# Patient Record
Sex: Female | Born: 2000 | Race: White | Hispanic: No | Marital: Single | Smoking: Current some day smoker
Health system: Southern US, Community
[De-identification: ages and names within clinical notes are randomized; demographics above are authoritative.]

## PROBLEM LIST (undated history)

## (undated) ENCOUNTER — Inpatient Hospital Stay: Payer: Self-pay

---
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
## 2018-07-04 ENCOUNTER — Other Ambulatory Visit: Payer: Self-pay | Admitting: Primary Care

## 2018-07-04 DIAGNOSIS — R102 Pelvic and perineal pain: Secondary | ICD-10-CM

## 2018-07-10 ENCOUNTER — Ambulatory Visit
Admission: RE | Admit: 2018-07-10 | Discharge: 2018-07-10 | Disposition: A | Discharge status: Home / Self Care | Payer: Medicaid Other | Source: Ambulatory Visit | Attending: Primary Care | Admitting: Primary Care

## 2018-07-10 DIAGNOSIS — N83292 Other ovarian cyst, left side: Secondary | ICD-10-CM | POA: Diagnosis not present

## 2018-07-10 DIAGNOSIS — R102 Pelvic and perineal pain: Secondary | ICD-10-CM | POA: Insufficient documentation

## 2018-12-24 ENCOUNTER — Other Ambulatory Visit: Payer: Self-pay

## 2018-12-24 ENCOUNTER — Emergency Department: Payer: Self-pay

## 2018-12-24 ENCOUNTER — Emergency Department
Admission: EM | Admit: 2018-12-24 | Discharge: 2018-12-24 | Disposition: A | Discharge status: Home / Self Care | Payer: Self-pay | Attending: Emergency Medicine | Admitting: Emergency Medicine

## 2018-12-24 DIAGNOSIS — F17200 Nicotine dependence, unspecified, uncomplicated: Secondary | ICD-10-CM | POA: Insufficient documentation

## 2018-12-24 DIAGNOSIS — R1031 Right lower quadrant pain: Secondary | ICD-10-CM

## 2018-12-24 DIAGNOSIS — N3001 Acute cystitis with hematuria: Secondary | ICD-10-CM | POA: Insufficient documentation

## 2018-12-24 DIAGNOSIS — R319 Hematuria, unspecified: Secondary | ICD-10-CM

## 2018-12-24 DIAGNOSIS — Z79899 Other long term (current) drug therapy: Secondary | ICD-10-CM | POA: Insufficient documentation

## 2018-12-24 DIAGNOSIS — N390 Urinary tract infection, site not specified: Secondary | ICD-10-CM

## 2018-12-24 LAB — CBC WITH DIFFERENTIAL/PLATELET
Abs Immature Granulocytes: 0.02 10*3/uL (ref 0.00–0.07)
Basophils Absolute: 0 10*3/uL (ref 0.0–0.1)
Basophils Relative: 0 %
Eosinophils Absolute: 0.1 10*3/uL (ref 0.0–0.5)
Eosinophils Relative: 2 %
HCT: 42.5 % (ref 36.0–46.0)
Hemoglobin: 15.1 g/dL — ABNORMAL HIGH (ref 12.0–15.0)
Immature Granulocytes: 0 %
Lymphocytes Relative: 37 %
Lymphs Abs: 3.4 10*3/uL (ref 0.7–4.0)
MCH: 29.8 pg (ref 26.0–34.0)
MCHC: 35.5 g/dL (ref 30.0–36.0)
MCV: 84 fL (ref 80.0–100.0)
Monocytes Absolute: 0.5 10*3/uL (ref 0.1–1.0)
Monocytes Relative: 6 %
Neutro Abs: 5.2 10*3/uL (ref 1.7–7.7)
Neutrophils Relative %: 55 %
Platelets: 251 10*3/uL (ref 150–400)
RBC: 5.06 MIL/uL (ref 3.87–5.11)
RDW: 11.8 % (ref 11.5–15.5)
WBC: 9.2 10*3/uL (ref 4.0–10.5)
nRBC: 0 % (ref 0.0–0.2)

## 2018-12-24 LAB — COMPREHENSIVE METABOLIC PANEL
ALT: 17 U/L (ref 0–44)
AST: 20 U/L (ref 15–41)
Albumin: 4.8 g/dL (ref 3.5–5.0)
Alkaline Phosphatase: 82 U/L (ref 38–126)
Anion gap: 10 (ref 5–15)
BUN: 15 mg/dL (ref 6–20)
CO2: 24 mmol/L (ref 22–32)
Calcium: 8.7 mg/dL — ABNORMAL LOW (ref 8.9–10.3)
Chloride: 105 mmol/L (ref 98–111)
Creatinine, Ser: 0.57 mg/dL (ref 0.44–1.00)
GFR calc Af Amer: >60 mL/min (ref >60)
GFR calc non Af Amer: >60 mL/min (ref >60)
Glucose, Bld: 109 mg/dL — ABNORMAL HIGH (ref 70–99)
Potassium: 3.8 mmol/L (ref 3.5–5.1)
Sodium: 139 mmol/L (ref 135–145)
Total Bilirubin: 0.6 mg/dL (ref 0.3–1.2)
Total Protein: 7.5 g/dL (ref 6.5–8.1)

## 2018-12-24 LAB — WET PREP, GENITAL
Clue Cells Wet Prep HPF POC: NONE SEEN
Sperm: NONE SEEN
Trich, Wet Prep: NONE SEEN
Yeast Wet Prep HPF POC: NONE SEEN

## 2018-12-24 LAB — URINALYSIS, ROUTINE W REFLEX MICROSCOPIC
Bilirubin Urine: NEGATIVE
Glucose, UA: NEGATIVE mg/dL
Ketones, ur: NEGATIVE mg/dL
Nitrite: NEGATIVE
Protein, ur: 100 mg/dL — AB
RBC / HPF: >50 RBC/hpf — ABNORMAL HIGH (ref 0–5)
Specific Gravity, Urine: 1.012 (ref 1.005–1.030)
WBC, UA: >50 WBC/hpf — ABNORMAL HIGH (ref 0–5)
pH: 6 (ref 5.0–8.0)

## 2018-12-24 LAB — LIPASE, BLOOD: Lipase: 25 U/L (ref 11–51)

## 2018-12-24 LAB — POCT PREGNANCY, URINE: Preg Test, Ur: NEGATIVE

## 2018-12-24 LAB — CHLAMYDIA/NGC RT PCR (ARMC ONLY): Chlamydia Tr: NOT DETECTED

## 2018-12-24 LAB — CHLAMYDIA/NGC RT PCR (ARMC ONLY)??????????: N gonorrhoeae: NOT DETECTED

## 2018-12-24 MED ORDER — CEPHALEXIN 500 MG PO CAPS
500.0000 mg | ORAL_CAPSULE | Freq: Once | ORAL | Status: AC
  Administered 2018-12-24: 500 mg via ORAL
  Filled 2018-12-24: qty 1

## 2018-12-24 MED ORDER — IOHEXOL 300 MG/ML  SOLN
75.0000 mL | Freq: Once | INTRAMUSCULAR | Status: AC | PRN
  Administered 2018-12-24: 75 mL via INTRAVENOUS

## 2018-12-24 MED ORDER — CEPHALEXIN 500 MG PO CAPS: 500.0000 mg | ORAL_CAPSULE | Freq: Four times a day (QID) | ORAL | 0 refills | Status: AC

## 2018-12-24 NOTE — ED Provider Notes (Signed)
[facility] Emergency Department Provider Note  ____________________________________________   First MD Initiated Contact with Patient 12/24/18 0222     (approximate)  I have reviewed the triage vital signs and the nursing notes.   HISTORY  Chief Complaint Abdominal Pain (?UTI)    HPI Nichole Hernandez is a 18 y.o. female with medical history as listed below who reports no chronic medical history and presents for evaluation of persistent but intermittent right lower quadrant pain for about 3 days.  She says that nothing in particular makes it better or worse and that it is quite severe and "excruciating" at times.  She said it is gone now because she took some ibuprofen at home but it has been severe recently.  It is been accompanied occasionally with nausea and at least one episode of vomiting, but she says she thinks that is because the pain was so bad.  She denies fever/chills, chest pain, shortness of breath, cough, upper abdominal pain, and any abnormal vaginal discomfort.  Her last menstrual period was about a week and a half ago.  She says that her last few menstrual cycles have been irregular and that she has been diagnosed with an ovarian cyst on the left side before that caused a similar kind of pain.  She does not take any medication regularly.  She says that she was diagnosed with a urinary tract infection "a while ago" and the first antibiotic she was given caused an allergic reaction but she completed the second dose.  She never had retesting to see if her UTI resolved.  She is having no dysuria nor hematuria at this time.  She is sexually active without using condoms with one partner and has been in a monogamous relationship for 3 years with this partner.         History reviewed. No pertinent past medical history.  There are no active problems to display for this patient.   History reviewed. No pertinent surgical history.  Prior to Admission  medications   Medication Sig Start Date End Date Taking? Authorizing Provider  cephALEXin (KEFLEX) 500 MG capsule Take 1 capsule (500 mg total) by mouth 4 (four) times daily for 12 days. 12/24/18 01/05/19  Loleta RoseForbach, Creed Kail, MD  cetirizine (ZYRTEC) 10 MG tablet Take 1 tablet (10 mg total) by mouth daily. 10/06/15   Cuthriell, Delorise RoyalsJonathan D, PA-C  fluticasone (FLONASE) 50 MCG/ACT nasal spray Place 1 spray into both nostrils 2 (two) times daily. 10/06/15   Cuthriell, Delorise RoyalsJonathan D, PA-C  magic mouthwash w/lidocaine SOLN Take 5 mLs by mouth 4 (four) times daily. 10/06/15   Cuthriell, Delorise RoyalsJonathan D, PA-C  methylPREDNISolone (MEDROL DOSEPAK) 4 MG TBPK tablet Take Tapered dose as directed 03/19/16   Joni ReiningSmith, Ronald K, PA-C    Allergies Amoxicillin  No family history on file.  Social History Social History   Tobacco Use   Smoking status: Current Some Day Smoker  Substance Use Topics   Alcohol use: No   Drug use: Not on file    Review of Systems Constitutional: No fever/chills Eyes: No visual changes. ENT: No sore throat. Cardiovascular: Denies chest pain. Respiratory: Denies shortness of breath. Gastrointestinal: Intermittent but recurrent right lower quadrant abdominal pain for about 3 days accompanied with nausea and at least one episode of emesis. Genitourinary: Negative for dysuria.  Last menstrual period was about a week and half ago. Musculoskeletal: Negative for neck pain.  Negative for back pain. Integumentary: Negative for rash. Neurological: Negative for headaches, focal  weakness or numbness.   ____________________________________________   PHYSICAL EXAM:  VITAL SIGNS: ED Triage Vitals  Enc Vitals Group     BP 12/24/18 0219 (!) 121/92     Pulse Rate 12/24/18 0219 94     Resp 12/24/18 0219 18     Temp 12/24/18 0219 97.8 F (36.6 C)     Temp Source 12/24/18 0219 Oral     SpO2 12/24/18 0219 99 %     Weight 12/24/18 0218 61.2 kg (135 lb)     Height 12/24/18 0218 1.575 m ( )      Head Circumference --      Peak Flow --      Pain Score 12/24/18 0217 6     Pain Loc --      Pain Edu? --      Excl. in GC? --     Constitutional: Alert and oriented. Well appearing and in no acute distress. Eyes: Conjunctivae are normal.  Head: Atraumatic. Nose: No congestion/rhinnorhea. Mouth/Throat: Mucous membranes are moist. Neck: No stridor.  No meningeal signs.   Cardiovascular: Normal rate, regular rhythm. Good peripheral circulation. Grossly normal heart sounds. Respiratory: Normal respiratory effort.  No retractions. Lungs CTAB. Gastrointestinal: Soft and nontender even to deep palpation.  No rebound and no guarding.  Negative Murphy sign, no tenderness at McBurney's point, negative Rovsing's sign. Genitourinary: Normal external exam.  No evidence of herpetic lesions.  Normal speculum exam without any evidence of cervicitis.  Minimal discharge.  Nontender bimanual examination with no cervical motion tenderness nor adnexal tenderness.  ED chaperone present throughout exam. Musculoskeletal: No lower extremity tenderness nor edema. No gross deformities of extremities. Neurologic:  Normal speech and language. No gross focal neurologic deficits are appreciated.  Skin:  Skin is warm, dry and intact. No rash noted. Psychiatric: Mood and affect are normal. Speech and behavior are normal.  ____________________________________________   LABS (all labs ordered are listed, but only abnormal results are displayed)  Labs Reviewed  WET PREP, GENITAL - Abnormal; Notable for the following components:      Result Value   WBC, Wet Prep HPF POC FEW (*)    All other components within normal limits  URINALYSIS, ROUTINE W REFLEX MICROSCOPIC - Abnormal; Notable for the following components:   Color, Urine YELLOW (*)    APPearance CLOUDY (*)    Hgb urine dipstick LARGE (*)    Protein, ur 100 (*)    Leukocytes,Ua LARGE (*)    RBC / HPF >50 (*)    WBC, UA >50 (*)    Bacteria, UA RARE (*)     All other components within normal limits  CBC WITH DIFFERENTIAL/PLATELET - Abnormal; Notable for the following components:   Hemoglobin 15.1 (*)    All other components within normal limits  COMPREHENSIVE METABOLIC PANEL - Abnormal; Notable for the following components:   Glucose, Bld 109 (*)    Calcium 8.7 (*)    All other components within normal limits  CHLAMYDIA/NGC RT PCR ([facility] ONLY)  URINE CULTURE  LIPASE, BLOOD  POC URINE PREG, ED  POCT PREGNANCY, URINE   ____________________________________________  EKG  No indication for EKG ____________________________________________  RADIOLOGY   ED MD interpretation:  Probable sequelae of UTI (enhancement of walls of collecting system and ureters)  Official radiology report(s): Ct Abdomen Pelvis W Contrast  Result Date: 12/24/2018 CLINICAL DATA:  Flank pain EXAM: CT ABDOMEN AND PELVIS WITH CONTRAST TECHNIQUE: Multidetector CT imaging of the abdomen and pelvis was performed using  the standard protocol following bolus administration of intravenous contrast. CONTRAST:  75mL OMNIPAQUE 300 COMPARISON:  None. FINDINGS: Lower chest: No acute abnormality. Hepatobiliary: No focal liver abnormality is seen. No gallstones, gallbladder wall thickening, or biliary dilatation. Pancreas: Unremarkable. No pancreatic ductal dilatation or surrounding inflammatory changes. Spleen: Normal in size without focal abnormality. Adrenals/Urinary Tract: Adrenal glands are within normal limits. Kidneys demonstrate normal enhancement bilaterally. No renal calculi or obstructive changes are seen. Mild enhancement of the walls of the collecting system is seen which may be related to underlying urinary tract infection. Correlation with urinalysis is recommended. Stomach/Bowel: Stomach is within normal limits. Appendix appears normal. No evidence of bowel wall thickening, distention, or inflammatory changes. Vascular/Lymphatic: No significant vascular findings are  present. No enlarged abdominal or pelvic lymph nodes. Reproductive: Uterus is within normal limits. Cystic changes are noted in the ovaries bilaterally. Dominant cyst is noted on the left measuring 3.6 cm in greatest dimension. Other: No free fluid is noted. No free air is seen. No hernia is identified. Musculoskeletal: No acute bony abnormality is noted. IMPRESSION: Mild enhancement of the walls of the collecting system and ureters particularly on the right suspicious for underlying urinary tract infection. Ovarian cystic changes as described. No other focal abnormality is noted. Electronically Signed   By: Alcide Clever M.D.   On: 12/24/2018 03:59    ____________________________________________   PROCEDURES   Procedure(s) performed (including Critical Care):  Procedures   ____________________________________________   INITIAL IMPRESSION / MDM / ASSESSMENT AND PLAN / ED COURSE  As part of my medical decision making, I reviewed the following data within the electronic MEDICAL RECORD NUMBER Nursing notes reviewed and incorporated, Labs reviewed , Old chart reviewed, Notes from prior ED visits and [place] Controlled Substance Database  Nichole Hernandez was evaluated in Emergency Department on 12/24/2018 for the symptoms described in the history of present illness. She was evaluated in the context of the global COVID-19 pandemic, which necessitated consideration that the patient might be at risk for infection with the SARS-CoV-2 virus that causes COVID-19. Institutional protocols and algorithms that pertain to the evaluation of patients at risk for COVID-19 are in a state of rapid change based on information released by regulatory bodies including the CDC and federal and state organizations. These policies and algorithms were followed during the patient's care in the ED.      Differential diagnosis includes, but is not limited to, mittelschmerz, ovarian cyst, STD/PID, UTI, renal colic, appendicitis,  diverticulitis.  The patient is very well-appearing and in no distress with absolutely no tenderness to palpation of her abdomen.  Her vital signs are stable and she is afebrile.  No signs or symptoms of COVID-19.  Based on the fact she is having absolutely no tenderness, I strongly doubt an acute intra-abdominal pathology; is most likely she is having some mittelschmerz or possibly some pain related to an ovarian cyst.  Given that she has unprotected sexual intercourse I do think it is reasonable to perform pelvic exam and she agrees.  Will check gonorrhea, chlamydia, and a wet prep, as well as checking a urinalysis and basic blood work.  I will reassess after the pelvic exam if any imaging is necessary but most I would obtain a pelvic ultrasound to look for ovarian cyst or pelvic free fluid; given her normal vital signs and lack of tenderness palpation, I do not think she would benefit from a CT scan at this time.  Clinical Course as of Dec 23 629  Mon Dec 24, 2018  0242 WBC: 9.2 [CF]  0302 Preg Test, Ur: NEGATIVE [CF]  0302 Normal comprehensive metabolic panel without any evidence of clinically significant abnormalities.  Comprehensive metabolic panel(!) [CF]  [id number] The patient has cloudy urine with what appears to be significant hematuria and rare bacteria.  It is possible that she has pyelonephritis or possibly renal colic.  It would be good to know whether she has a stone in the setting of questionable infection.  Although she is not systemically ill, given her description of her intermittent symptoms and the results we have seen so far I think she would benefit from a CT scan of the abdomen and pelvis to look for any sign of ureteral stone/obstruction as well as any sign of pyelonephritis.  I will scan her with IV contrast only for optimal evaluation of her kidneys as well as to try and identify the presence of a stone.  I discussed all this with her and she understands and agrees with the plan.    Urinalysis, Routine w reflex microscopic(!) [CF]  0325 No evidence of BV nor trichomoniasis  Wet prep, genital(!) [CF]  0402 No acute abnormalities except for mild enhancement of the walls of the collecting system and ureters particularly on the right suspicious for underlying urinary tract infection.  No evidence of obstruction.  Will begin treatment with Keflex 500 mg PO and give full course of treatment as for pyelonephritis. Updated patient, gave usual/customary return precautions.  She understands and agrees.  CT ABDOMEN PELVIS W CONTRAST [CF]    Clinical Course User Index [CF] Loleta Rose, MD    ____________________________________________  FINAL CLINICAL IMPRESSION(S) / ED DIAGNOSES  Final diagnoses:  Urinary tract infection with hematuria, site unspecified  RLQ abdominal pain     MEDICATIONS GIVEN DURING THIS VISIT:  Medications  iohexol (OMNIPAQUE) 300 MG/ML solution 75 mL (75 mLs Intravenous Contrast Given 12/24/18 0342)  cephALEXin (KEFLEX) capsule 500 mg (500 mg Oral Given 12/24/18 0416)     ED Discharge Orders         Ordered    cephALEXin (KEFLEX) 500 MG capsule  4 times daily     12/24/18 0405           Note:  This document was prepared using Dragon voice recognition software and may include unintentional dictation errors.   Loleta Rose, MD 12/24/18 [phone]

## 2018-12-24 NOTE — ED Triage Notes (Signed)
Patient presents with right lower quadrant abdominal pain. States had UTI that she did not seek treatment for but when she did, didn't get the antibiotic filled. Now with "excruciating pain in right lower quadrant." Nausea without vomiting.

## 2018-12-24 NOTE — Discharge Instructions (Addendum)
As we discussed, we believe you could be having ovarian pain that sometimes occurs in between your menstrual cycle (mittelschmerz), but also likely having pain due to a persistent urinary tract infection.  Please take the full course of treatment of antibiotics which is dosed 4 times a day for 12 days.  Follow-up with your regular doctor in about 2 weeks.  Return to the emergency department if you develop new or worsening symptoms that concern you.

## 2018-12-26 LAB — URINE CULTURE
Culture: >=100000 — AB
Special Requests: NORMAL

## 2019-06-26 ENCOUNTER — Other Ambulatory Visit: Payer: Self-pay | Admitting: *Deleted

## 2019-06-26 DIAGNOSIS — Z20822 Contact with and (suspected) exposure to covid-19: Secondary | ICD-10-CM

## 2019-06-27 LAB — NOVEL CORONAVIRUS, NAA: SARS-CoV-2, NAA: NOT DETECTED

## 2019-08-04 ENCOUNTER — Emergency Department
Admission: EM | Admit: 2019-08-04 | Discharge: 2019-08-04 | Disposition: A | Discharge status: Home / Self Care | Payer: Self-pay | Attending: Emergency Medicine | Admitting: Emergency Medicine

## 2019-08-04 DIAGNOSIS — R109 Unspecified abdominal pain: Secondary | ICD-10-CM | POA: Insufficient documentation

## 2019-08-04 DIAGNOSIS — Z5321 Procedure and treatment not carried out due to patient leaving prior to being seen by health care provider: Secondary | ICD-10-CM | POA: Insufficient documentation

## 2019-08-04 LAB — CBC
HCT: 38.3 % (ref 36.0–46.0)
Hemoglobin: 13.6 g/dL (ref 12.0–15.0)
MCH: 29.6 pg (ref 26.0–34.0)
MCHC: 35.5 g/dL (ref 30.0–36.0)
MCV: 83.3 fL (ref 80.0–100.0)
Platelets: 265 10*3/uL (ref 150–400)
RBC: 4.6 MIL/uL (ref 3.87–5.11)
RDW: 11.9 % (ref 11.5–15.5)
WBC: 9.7 10*3/uL (ref 4.0–10.5)
nRBC: 0 % (ref 0.0–0.2)

## 2019-08-04 LAB — COMPREHENSIVE METABOLIC PANEL
ALT: 14 U/L (ref 0–44)
AST: 16 U/L (ref 15–41)
Albumin: 5 g/dL (ref 3.5–5.0)
Alkaline Phosphatase: 74 U/L (ref 38–126)
Anion gap: 12 (ref 5–15)
BUN: 12 mg/dL (ref 6–20)
CO2: 23 mmol/L (ref 22–32)
Calcium: 9.2 mg/dL (ref 8.9–10.3)
Chloride: 103 mmol/L (ref 98–111)
Creatinine, Ser: 0.58 mg/dL (ref 0.44–1.00)
GFR calc Af Amer: >60 mL/min (ref >60)
GFR calc non Af Amer: >60 mL/min (ref >60)
Glucose, Bld: 116 mg/dL — ABNORMAL HIGH (ref 70–99)
Potassium: 3.3 mmol/L — ABNORMAL LOW (ref 3.5–5.1)
Sodium: 138 mmol/L (ref 135–145)
Total Bilirubin: 0.7 mg/dL (ref 0.3–1.2)
Total Protein: 7.8 g/dL (ref 6.5–8.1)

## 2019-08-04 NOTE — ED Triage Notes (Signed)
Patient with lower abdominal pain after having intercourse about an hour ago. Patient denies vaginal bleeding.

## 2019-08-05 ENCOUNTER — Telehealth: Payer: Self-pay | Admitting: Emergency Medicine

## 2019-08-05 NOTE — Telephone Encounter (Signed)
Called patient due to lwot to inquire about condition and follow up plans. No answer. 

## 2019-12-26 IMAGING — US US PELVIS COMPLETE TRANSABD/TRANSVAG
1 series · 13 of 25 positions shown · non-contrast
Comparison: None

CLINICAL DATA: Initial evaluation for pelvic pain

EXAM:
TRANSABDOMINAL AND TRANSVAGINAL ULTRASOUND OF PELVIS
TECHNIQUE: Both transabdominal and transvaginal ultrasound examinations of the
pelvis were performed. Transabdominal technique was performed for
global imaging of the pelvis including uterus, ovaries, adnexal
regions, and pelvic cul-de-sac. It was necessary to proceed with
endovaginal exam following the transabdominal exam to visualize the
pelvic structures.

[Series 1: us pelvis complete transabd/transvag · 13 of 113 slices shown]
[im 1/113]
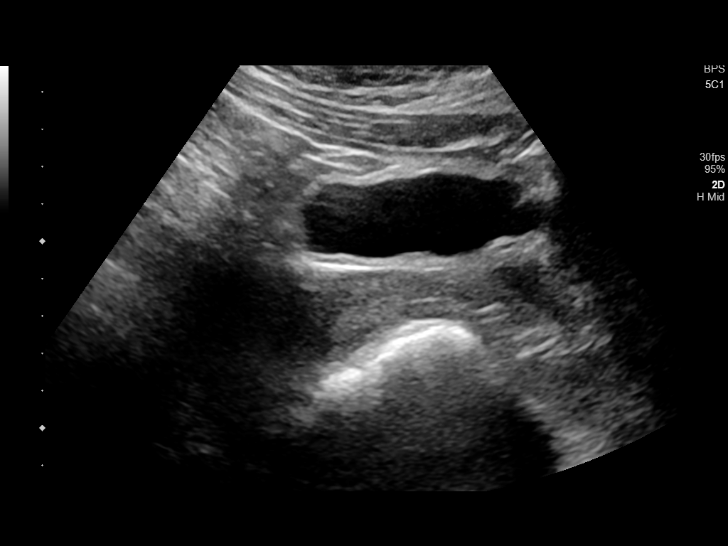
[im 10/113]
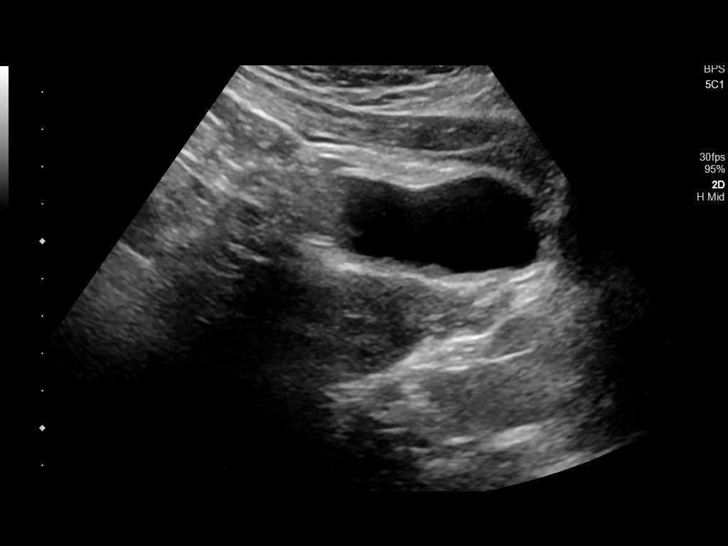
[im 19/113]
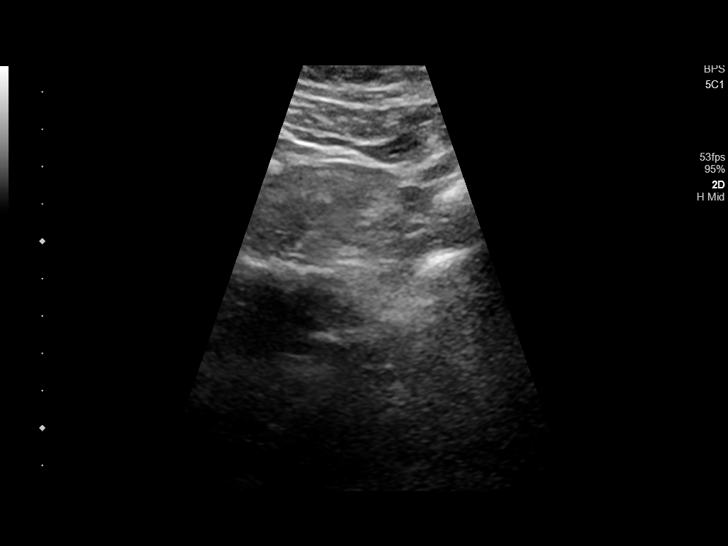
[im 29/113]
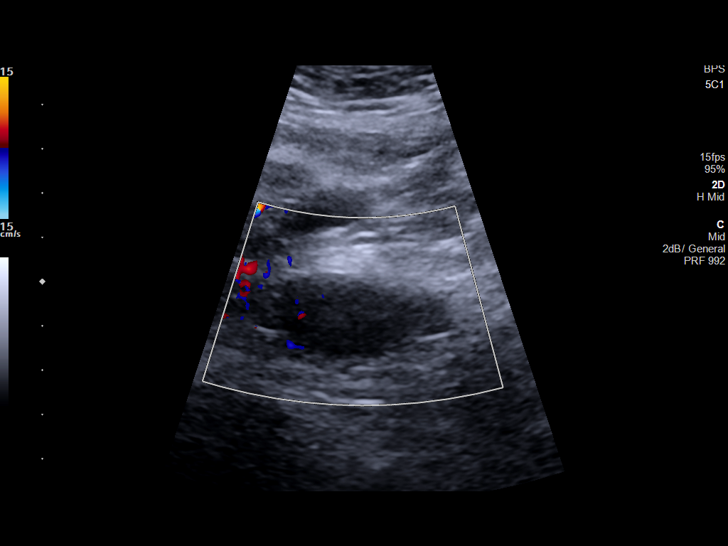
[im 38/113]
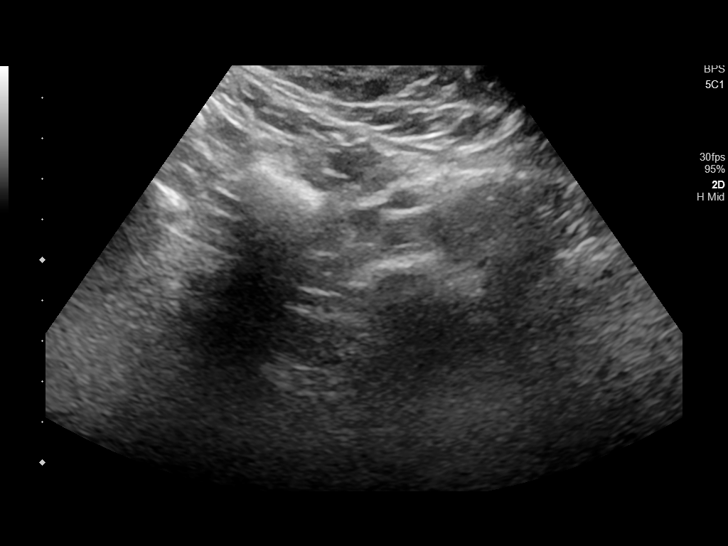
[im 47/113]
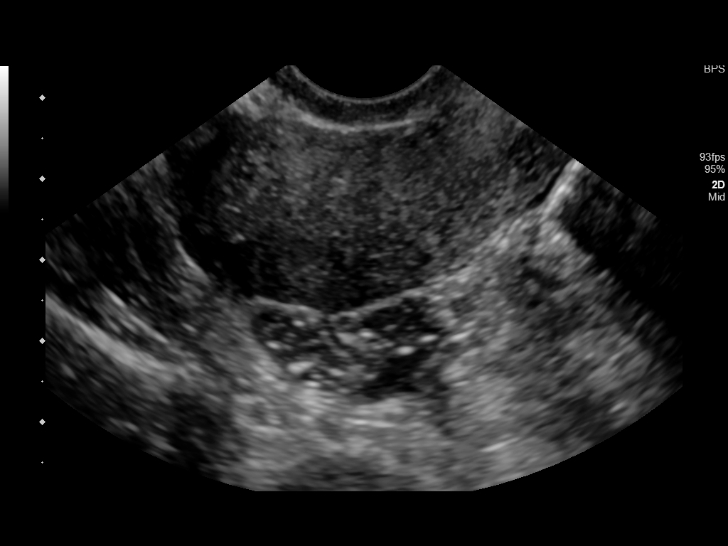
[im 57/113]
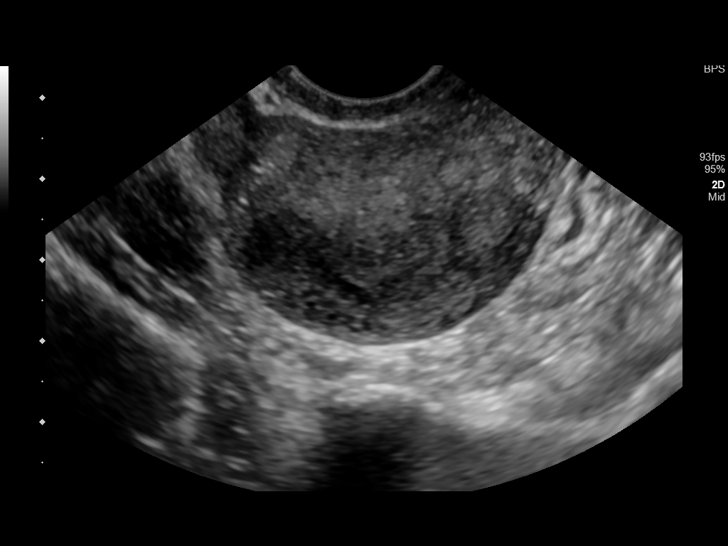
[im 66/113]
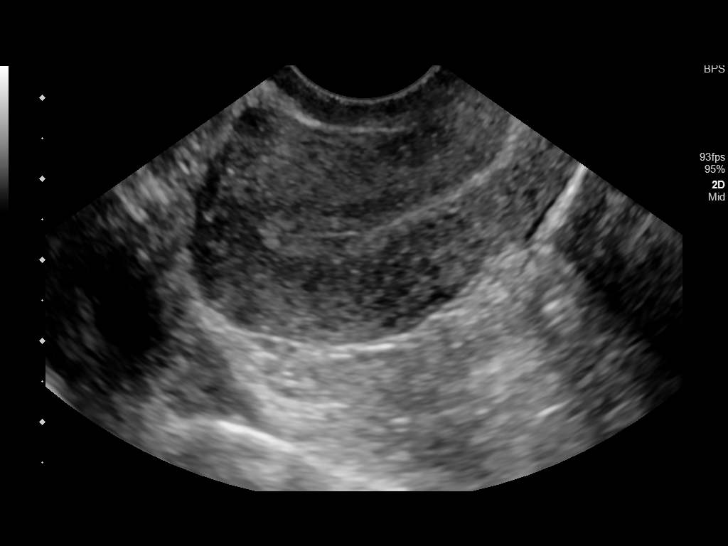
[im 75/113]
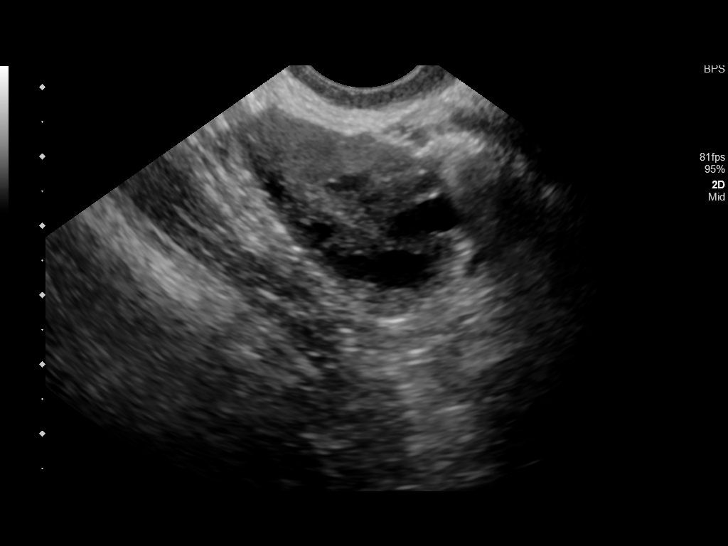
[im 85/113]
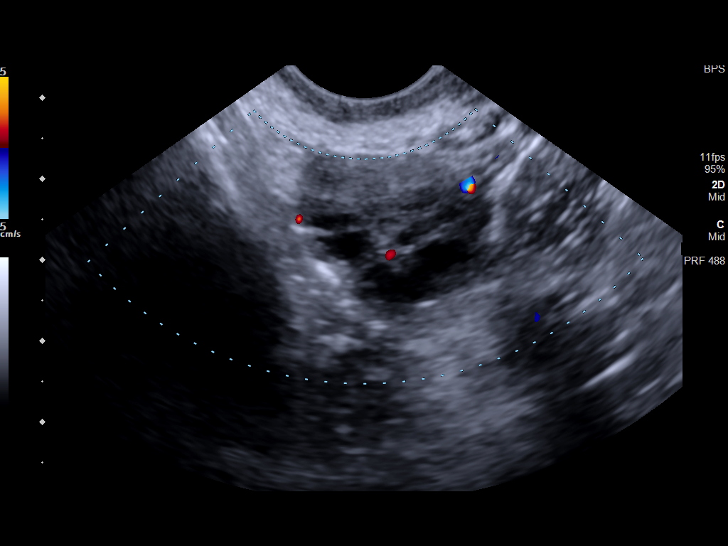
[im 94/113]
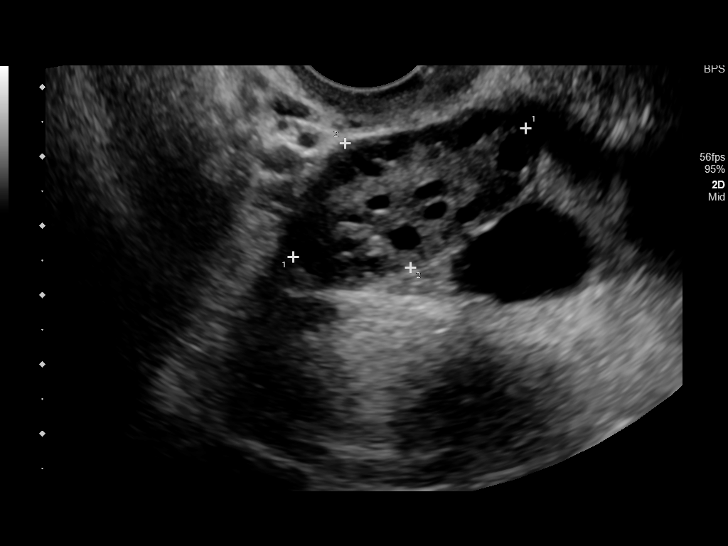
[im 103/113]
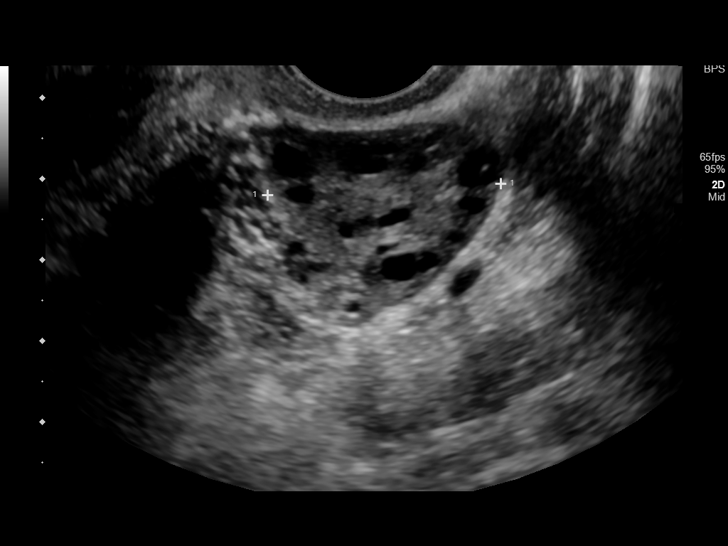
[im 113/113]
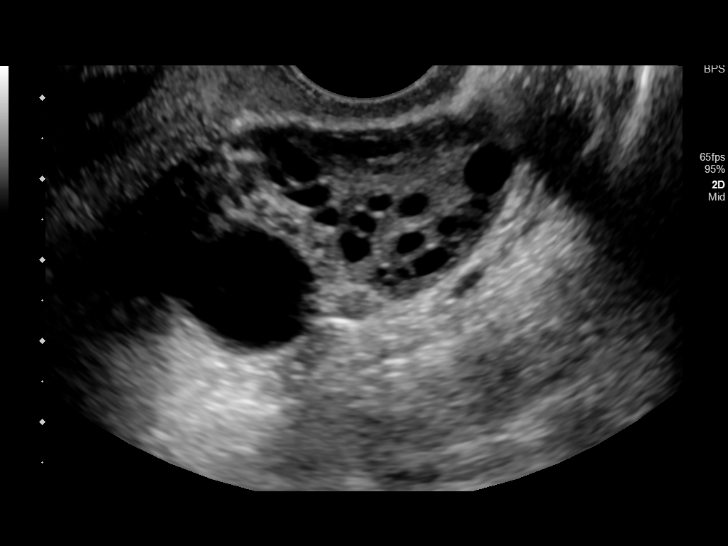

[13 of 25 positions shown; findings below may reference images not displayed]

FINDINGS: Uterus

Measurements: 4.4 x 2.8 x 4.3 cm. No fibroids or other mass
visualized.

Endometrium

Thickness: 5.7 mm.  No focal abnormality visualized.

Right ovary

Measurements: 3.8 x 2.0 x 2.3 cm. Normal appearance/no adnexal mass.

Left ovary

Measurements: 3.8 x 2.0 x 2.9 cm. 3.3 x 2.0 x 3.5 cm simple cyst
adjacent to the left ovary, likely reflecting an exophytic
follicular cyst or possibly paraovarian cyst.

Other findings

No abnormal free fluid.
IMPRESSION: 1. 3.5 cm simple cyst adjacent to the left ovary, likely an
exophytic physiologic follicular cyst or possibly paraovarian cyst.
This is almost certainly benign, and no specific imaging follow up
is recommended according to the Society of Radiologists in
6ltrasoundDVUV Consensus Conference Statement (Klpigbb Moolman et al.
Management of Asymptomatic Ovarian and Other Adnexal Cysts Imaged at
US: Society of Radiologists in Ultrasound Consensus Conference
2. Otherwise unremarkable and normal pelvic ultrasound.

## 2020-05-05 ENCOUNTER — Emergency Department: Admission: EM | Admit: 2020-05-05 | Discharge: 2020-05-05 | Discharge status: Against Medical Advice | Payer: Self-pay

## 2020-05-05 NOTE — ED Notes (Signed)
Pt comes to desk and states she is going to leave and go to her PCP. Pt states she feels better. Pt ambulatory with steady gait.

## 2020-06-10 IMAGING — CT CT ABDOMEN AND PELVIS WITH CONTRAST
2 of 4 series · 16 of 46 positions shown, 18 images · IV contrast (APPLIED)
Comparison: None.

CLINICAL DATA: Flank pain

EXAM:
CT ABDOMEN AND PELVIS WITH CONTRAST
TECHNIQUE: Multidetector CT imaging of the abdomen and pelvis was performed
using the standard protocol following bolus administration of
intravenous contrast.
CONTRAST:  75mL OMNIPAQUE 300

[Series 3: routine abd/pel with · axial · 0.64mm/px · z∈[-1022,-597]mm · 13 of 93 slices shown, 15 images]
[im 4/93  soft-tissue]
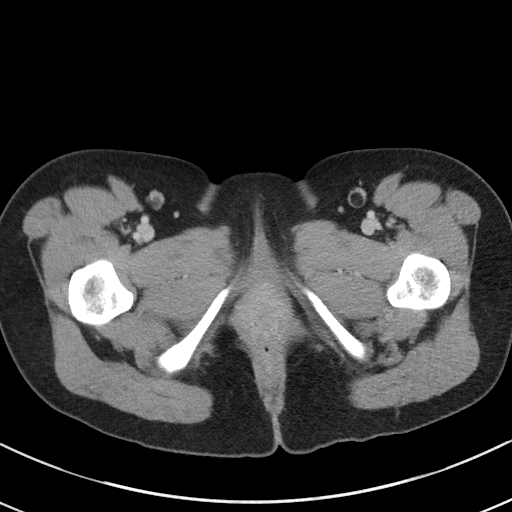
[im 4/93  bone]
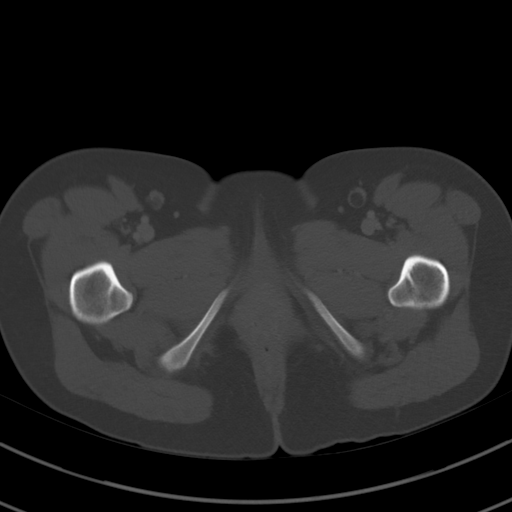
[im 12/93  soft-tissue]
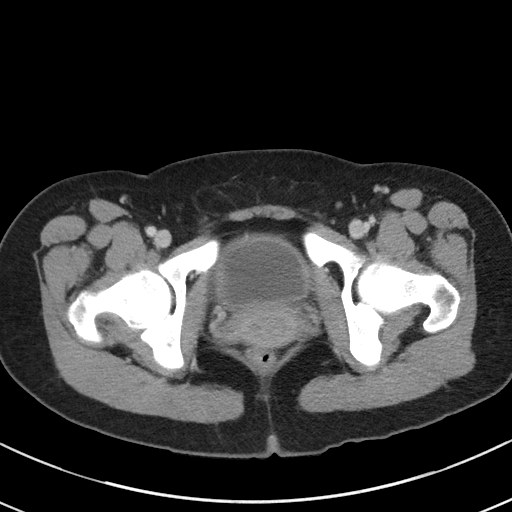
[im 20/93  soft-tissue]
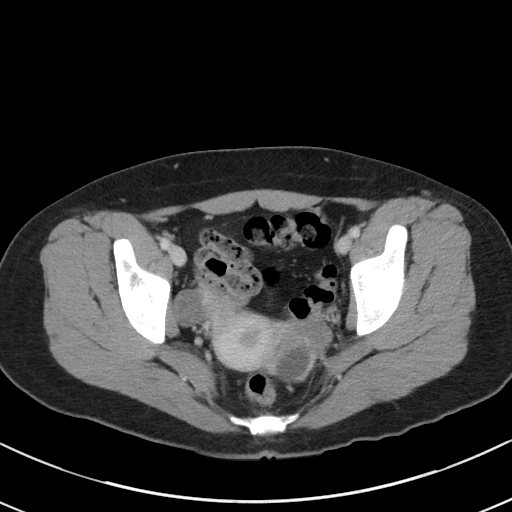
[im 27/93  soft-tissue]
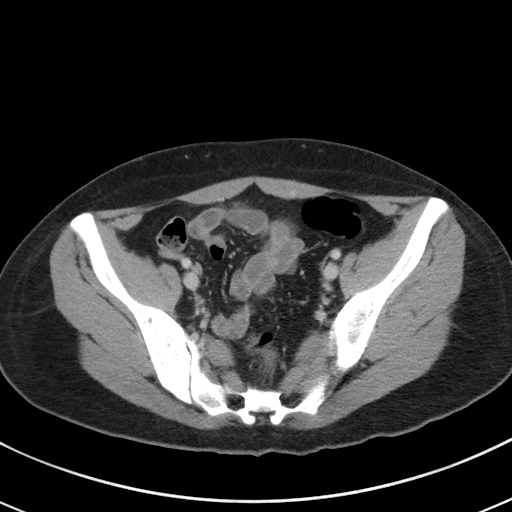
[im 31/93  soft-tissue]
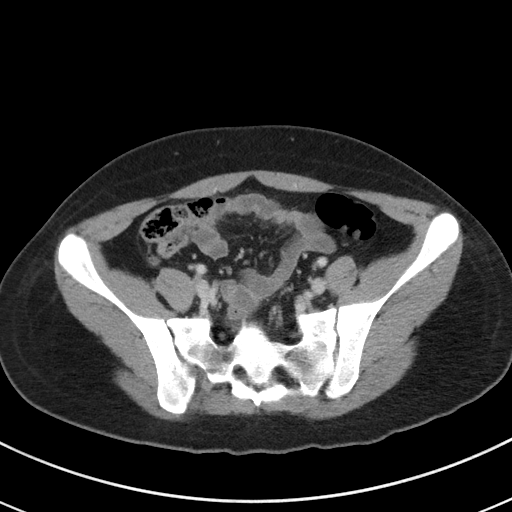
[im 39/93  soft-tissue]
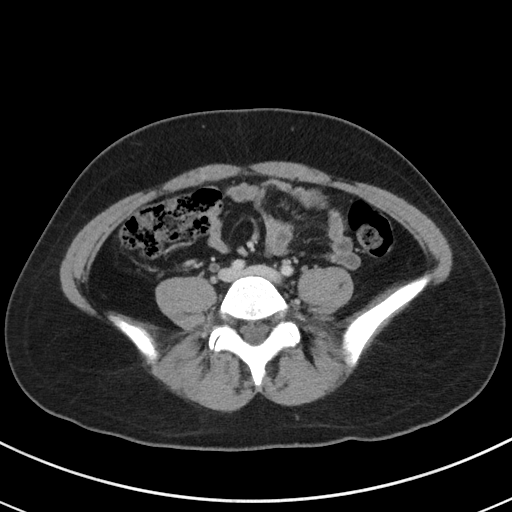
[im 47/93  soft-tissue]
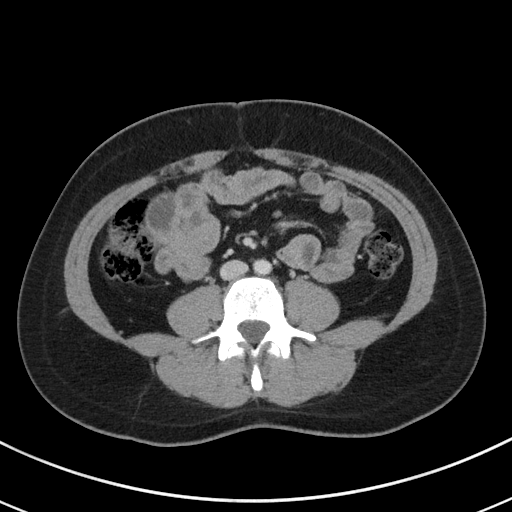
[im 54/93  soft-tissue]
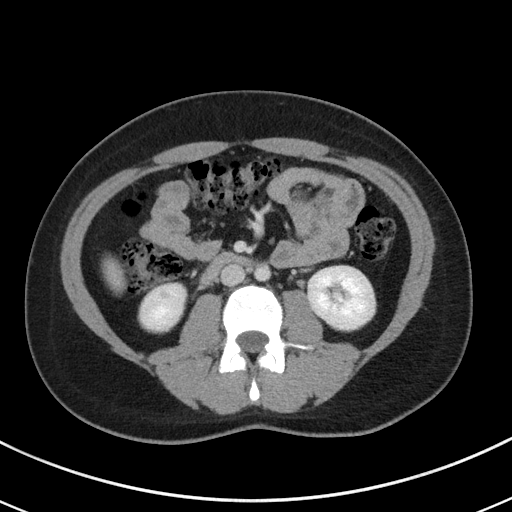
[im 62/93  soft-tissue]
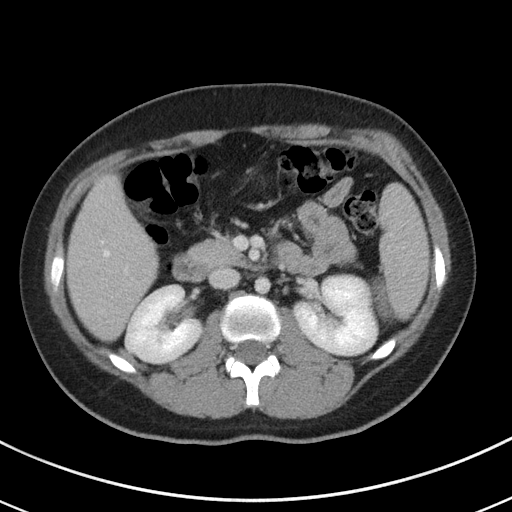
[im 62/93  bone]
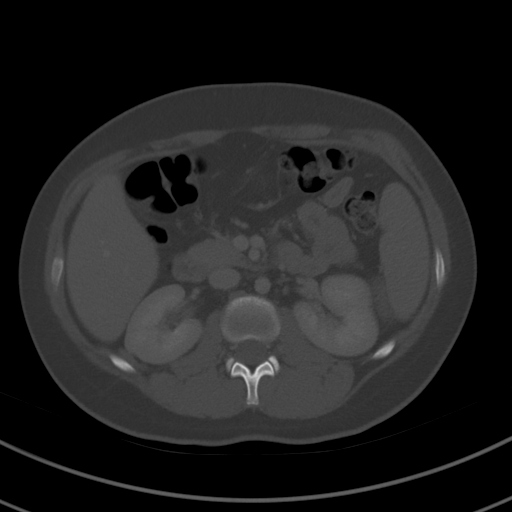
[im 66/93  soft-tissue]
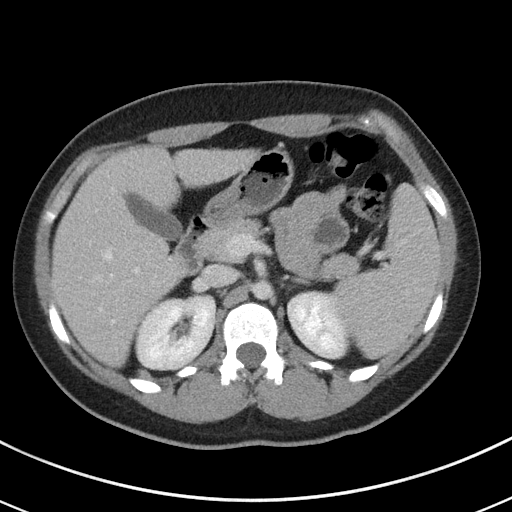
[im 73/93  soft-tissue]
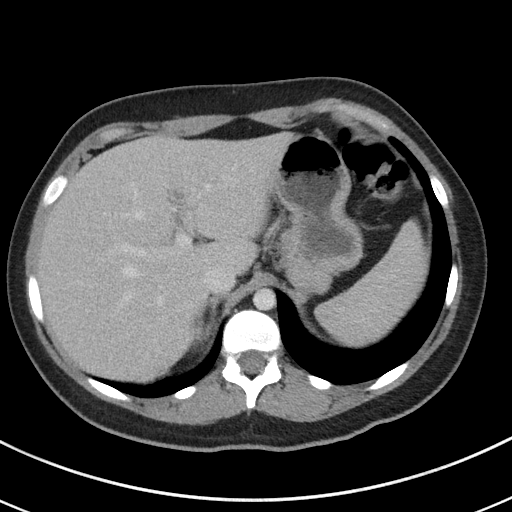
[im 81/93  soft-tissue]
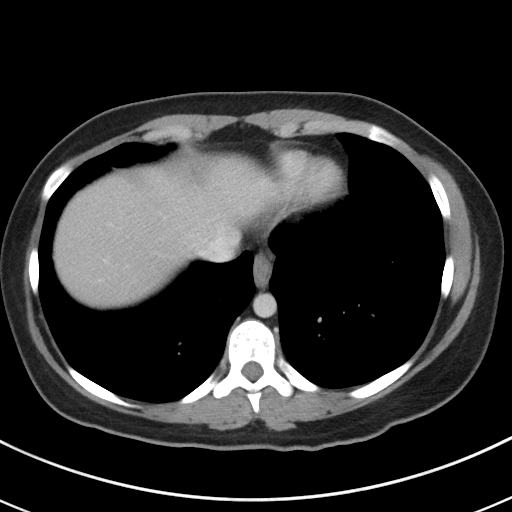
[im 89/93  soft-tissue]
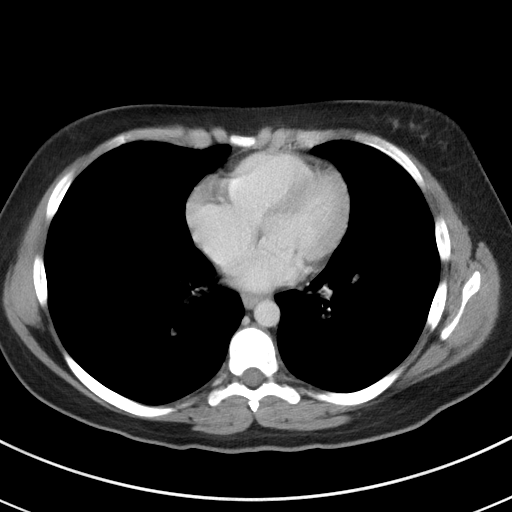

[Series 6: coronal st · coronal · 0.70mm/px · 3 of 77 slices shown]
[im 26/77  soft-tissue]
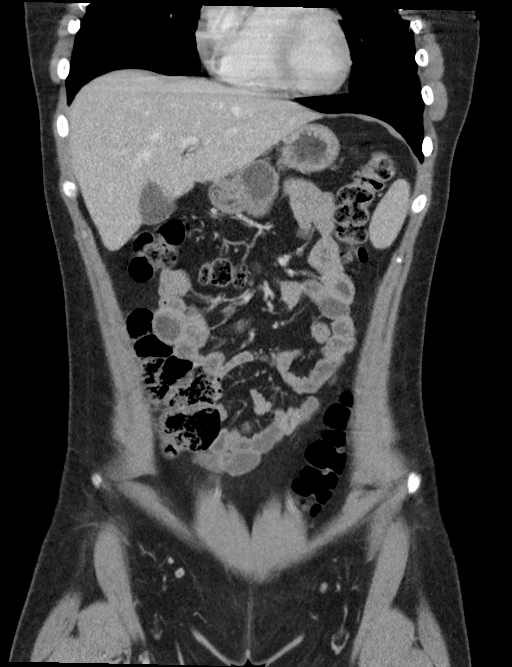
[im 34/77  soft-tissue]
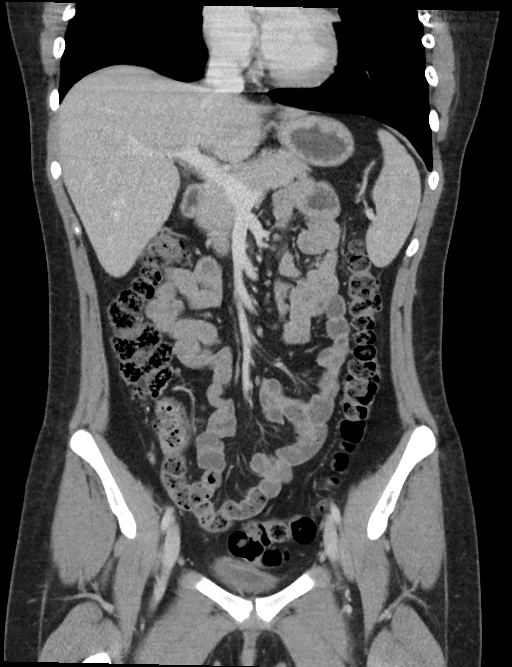
[im 43/77  soft-tissue]
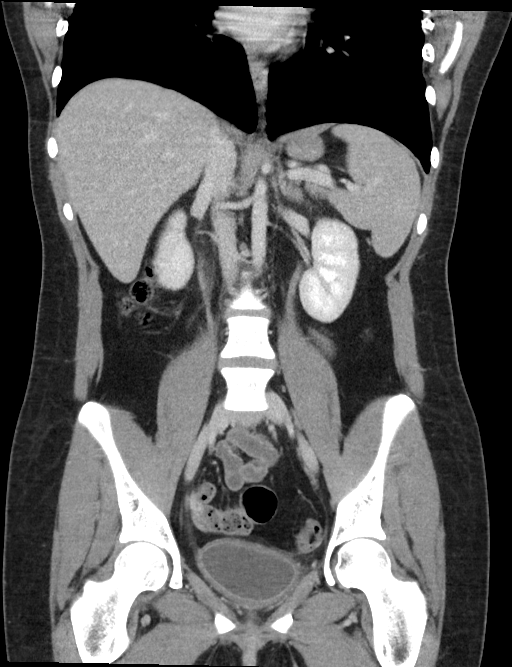

[16 of 46 positions shown; findings below may reference images not displayed]

FINDINGS: Lower chest: No acute abnormality.

Hepatobiliary: No focal liver abnormality is seen. No gallstones,
gallbladder wall thickening, or biliary dilatation.

Pancreas: Unremarkable. No pancreatic ductal dilatation or
surrounding inflammatory changes.

Spleen: Normal in size without focal abnormality.

Adrenals/Urinary Tract: Adrenal glands are within normal limits.
Kidneys demonstrate normal enhancement bilaterally. No renal calculi
or obstructive changes are seen. Mild enhancement of the walls of
the collecting system is seen which may be related to underlying
urinary tract infection. Correlation with urinalysis is recommended.

Stomach/Bowel: Stomach is within normal limits. Appendix appears
normal. No evidence of bowel wall thickening, distention, or
inflammatory changes.

Vascular/Lymphatic: No significant vascular findings are present. No
enlarged abdominal or pelvic lymph nodes.

Reproductive: Uterus is within normal limits. Cystic changes are
noted in the ovaries bilaterally. Dominant cyst is noted on the left
measuring 3.6 cm in greatest dimension.

Other: No free fluid is noted. No free air is seen. No hernia is
identified.

Musculoskeletal: No acute bony abnormality is noted.
IMPRESSION: Mild enhancement of the walls of the collecting system and ureters
particularly on the right suspicious for underlying urinary tract
infection.

Ovarian cystic changes as described.

No other focal abnormality is noted.

## 2022-12-17 ENCOUNTER — Other Ambulatory Visit: Payer: Self-pay

## 2022-12-17 ENCOUNTER — Emergency Department: Payer: No Typology Code available for payment source

## 2022-12-17 ENCOUNTER — Emergency Department
Admission: EM | Admit: 2022-12-17 | Discharge: 2022-12-17 | Disposition: A | Discharge status: Home / Self Care | Payer: No Typology Code available for payment source | Attending: Emergency Medicine | Admitting: Emergency Medicine

## 2022-12-17 DIAGNOSIS — Z3A01 Less than 8 weeks gestation of pregnancy: Secondary | ICD-10-CM | POA: Diagnosis not present

## 2022-12-17 DIAGNOSIS — O26851 Spotting complicating pregnancy, first trimester: Secondary | ICD-10-CM | POA: Diagnosis not present

## 2022-12-17 DIAGNOSIS — O209 Hemorrhage in early pregnancy, unspecified: Secondary | ICD-10-CM | POA: Diagnosis present

## 2022-12-17 DIAGNOSIS — N939 Abnormal uterine and vaginal bleeding, unspecified: Secondary | ICD-10-CM

## 2022-12-17 LAB — URINALYSIS, W/ REFLEX TO CULTURE (INFECTION SUSPECTED)
Bacteria, UA: NONE SEEN
Bilirubin Urine: NEGATIVE
Glucose, UA: NEGATIVE mg/dL
Ketones, ur: NEGATIVE mg/dL
Leukocytes,Ua: NEGATIVE
Nitrite: NEGATIVE
Protein, ur: 30 mg/dL — AB
RBC / HPF: >50 RBC/hpf (ref 0–5)
Specific Gravity, Urine: 1.016 (ref 1.005–1.030)
pH: 5 (ref 5.0–8.0)

## 2022-12-17 LAB — CBC WITH DIFFERENTIAL/PLATELET
Abs Immature Granulocytes: 0.02 10*3/uL (ref 0.00–0.07)
Basophils Absolute: 0 10*3/uL (ref 0.0–0.1)
Basophils Relative: 0 %
Eosinophils Absolute: 0.1 10*3/uL (ref 0.0–0.5)
Eosinophils Relative: 2 %
HCT: 42.5 % (ref 36.0–46.0)
Hemoglobin: 14.6 g/dL (ref 12.0–15.0)
Immature Granulocytes: 0 %
Lymphocytes Relative: 32 %
Lymphs Abs: 2.2 10*3/uL (ref 0.7–4.0)
MCH: 28.9 pg (ref 26.0–34.0)
MCHC: 34.4 g/dL (ref 30.0–36.0)
MCV: 84.2 fL (ref 80.0–100.0)
Monocytes Absolute: 0.3 10*3/uL (ref 0.1–1.0)
Monocytes Relative: 4 %
Neutro Abs: 4.4 10*3/uL (ref 1.7–7.7)
Neutrophils Relative %: 62 %
Platelets: 268 10*3/uL (ref 150–400)
RBC: 5.05 MIL/uL (ref 3.87–5.11)
RDW: 11.9 % (ref 11.5–15.5)
WBC: 7.1 10*3/uL (ref 4.0–10.5)
nRBC: 0 % (ref 0.0–0.2)

## 2022-12-17 LAB — BASIC METABOLIC PANEL
Anion gap: 11 (ref 5–15)
BUN: 12 mg/dL (ref 6–20)
CO2: 24 mmol/L (ref 22–32)
Calcium: 9.1 mg/dL (ref 8.9–10.3)
Chloride: 105 mmol/L (ref 98–111)
Creatinine, Ser: 0.6 mg/dL (ref 0.44–1.00)
GFR, Estimated: >60 mL/min (ref >60)
Glucose, Bld: 118 mg/dL — ABNORMAL HIGH (ref 70–99)
Potassium: 3.4 mmol/L — ABNORMAL LOW (ref 3.5–5.1)
Sodium: 140 mmol/L (ref 135–145)

## 2022-12-17 LAB — POC URINE PREG, ED: Preg Test, Ur: NEGATIVE

## 2022-12-17 LAB — ABO/RH: ABO/RH(D): A POS

## 2022-12-17 LAB — HCG, QUANTITATIVE, PREGNANCY: hCG, Beta Chain, Quant, S: 4 m[IU]/mL (ref <5)

## 2022-12-17 NOTE — ED Notes (Signed)
Wrote to provider to see if Hcg to be drawn in triage.

## 2022-12-17 NOTE — ED Triage Notes (Signed)
Pt to ED for vaginal bleeding since this AM and cramping since several days. Pt is about [redacted] weeks pregnant. Bleeding started brown, now pink-red. No clots. Light.   LNMP was 11/12/22. (+) home preg test 4/3 and several times since then. Denies dizziness. Skin dry.   Provider to decide on orders (urine, blood) if needed.

## 2022-12-17 NOTE — ED Provider Notes (Signed)
[facility] Provider Note    Event Date/Time   First MD Initiated Contact with Patient 12/17/22 1610     (approximate)   History   Vaginal Bleeding   HPI  Nichole Hernandez is a 22 y.o. female G1 P0 currently estimated to be approximately [redacted] weeks pregnant by reported LMP presents today for evaluation of vaginal bleeding and cramping that began over the last couple of days.  She reports that her last menstrual period was 11/12/2022, and she took a home positive pregnancy test on 4/3 and is confirmed with multiple pregnancy test since that time.  She reports that she began to have cramping a couple of days ago and bleeding that began today.  She denies any other vaginal discharge.  She denies abdominal pain.  No nausea or vomiting.  No flank pain.  There are no problems to display for this patient.         Physical Exam   Triage Vital Signs: ED Triage Vitals  Enc Vitals Group     BP 12/17/22 1528 (!) 130/93     Pulse Rate 12/17/22 1528 (!) 111     Resp 12/17/22 1528 18     Temp 12/17/22 1528 98.1 F (36.7 C)     Temp Source 12/17/22 1528 Oral     SpO2 12/17/22 1528 99 %     Weight 12/17/22 1528 132 lb (59.9 kg)     Height 12/17/22 1528 5\' 3"  (1.6 m)     Head Circumference --      Peak Flow --      Pain Score 12/17/22 1534 2     Pain Loc --      Pain Edu? --      Excl. in GC? --     Most recent vital signs: Vitals:   12/17/22 1528 12/17/22 1717  BP: (!) 130/93 (!) 136/90  Pulse: (!) 111 81  Resp: 18 16  Temp: 98.1 F (36.7 C)   SpO2: 99% 98%    Physical Exam Vitals and nursing note reviewed.  Constitutional:      General: Awake and alert. No acute distress.    Appearance: Normal appearance. The patient is normal weight.  HENT:     Head: Normocephalic and atraumatic.     Mouth: Mucous membranes are moist.  Eyes:     General: PERRL. Normal EOMs        Right eye: No discharge.        Left eye: No discharge.      Conjunctiva/sclera: Conjunctivae normal.  Cardiovascular:     Rate and Rhythm: Normal rate and regular rhythm.     Pulses: Normal pulses.     Heart sounds: Normal heart sounds Pulmonary:     Effort: Pulmonary effort is normal. No respiratory distress.     Breath sounds: Normal breath sounds.  Abdominal:     Abdomen is soft. There is no abdominal tenderness. No rebound or guarding. No distention.  No CVAT Musculoskeletal:        General: No swelling. Normal range of motion.     Cervical back: Normal range of motion and neck supple.  Skin:    General: Skin is warm and dry.     Capillary Refill: Capillary refill takes less than 2 seconds.     Findings: No rash.  Neurological:     Mental Status: The patient is awake and alert.      ED Results / Procedures / Treatments  Labs (all labs ordered are listed, but only abnormal results are displayed) Labs Reviewed  BASIC METABOLIC PANEL - Abnormal; Notable for the following components:      Result Value   Potassium 3.4 (*)    Glucose, Bld 118 (*)    All other components within normal limits  URINALYSIS, W/ REFLEX TO CULTURE (INFECTION SUSPECTED) - Abnormal; Notable for the following components:   Color, Urine YELLOW (*)    APPearance HAZY (*)    Hgb urine dipstick LARGE (*)    Protein, ur 30 (*)    All other components within normal limits  URINE CULTURE  HCG, QUANTITATIVE, PREGNANCY  CBC WITH DIFFERENTIAL/PLATELET  POC URINE PREG, ED  ABO/RH     EKG     RADIOLOGY     PROCEDURES:  Critical Care performed:   Procedures   MEDICATIONS ORDERED IN ED: Medications - No data to display   IMPRESSION / MDM / ASSESSMENT AND PLAN / ED COURSE  I reviewed the triage vital signs and the nursing notes.   Differential diagnosis includes, but is not limited to, intrauterine pregnancy, ectopic pregnancy, subchorionic hemorrhage, abnormal uterine bleeding.  Patient is awake and alert, mildly tachycardic on arrival though  normotensive and afebrile.  Her abdomen is soft and nontender throughout.  She is nontoxic in appearance.  Labs obtained in triage are overall normal.  However, her hCG returned negative and her point-of-care urine is negative for pregnancy as well.  Given her positive pregnancy test at home, it is likely that she has miscarried.  There is no indication for ultrasound at this time given negative pregnancy and lack of abdominal pain.  Discussed these findings with the patient.  She does not wish to wait for the remainder of her blood tests.  She has an OB/GYN already, I recommended outpatient follow-up with OB/GYN.  In the meantime, we discussed return precautions.  Patient presents and agrees with plan.  She was discharged in stable condition.   Patient's presentation is most consistent with acute complicated illness / injury requiring diagnostic workup.    FINAL CLINICAL IMPRESSION(S) / ED DIAGNOSES   Final diagnoses:  Vaginal bleeding     Rx / DC Orders   ED Discharge Orders     None        Note:  This document was prepared using Dragon voice recognition software and may include unintentional dictation errors.   Jackelyn Hoehn, PA-C 12/17/22 1723    Merwyn Katos, MD 12/17/22 [phone]

## 2022-12-17 NOTE — Discharge Instructions (Addendum)
Your pregnancy test is negative.  It is possible that you have miscarried.  Please follow-up with your outpatient OB/GYN.  Please return for any new, worsening, or change in symptoms or other concerns.

## 2022-12-19 LAB — URINE CULTURE: Culture: 30000 — AB

## 2023-10-30 DIAGNOSIS — Z8759 Personal history of other complications of pregnancy, childbirth and the puerperium: Secondary | ICD-10-CM | POA: Insufficient documentation

## 2024-03-04 DIAGNOSIS — Z87898 Personal history of other specified conditions: Secondary | ICD-10-CM | POA: Insufficient documentation

## 2024-03-19 LAB — PANORAMA PRENATAL TEST FULL PANEL:PANORAMA TEST PLUS 5 ADDITIONAL MICRODELETIONS: FETAL FRACTION: 7

## 2024-03-21 DIAGNOSIS — O0990 Supervision of high risk pregnancy, unspecified, unspecified trimester: Secondary | ICD-10-CM | POA: Insufficient documentation

## 2024-03-21 DIAGNOSIS — O35DXX0 Maternal care for other (suspected) fetal abnormality and damage, fetal gastrointestinal anomalies, not applicable or unspecified: Secondary | ICD-10-CM | POA: Insufficient documentation

## 2024-04-26 ENCOUNTER — Encounter: Payer: Self-pay | Admitting: Obstetrics and Gynecology

## 2024-04-26 ENCOUNTER — Other Ambulatory Visit: Payer: Self-pay

## 2024-04-26 ENCOUNTER — Observation Stay
Admission: EM | Admit: 2024-04-26 | Discharge: 2024-04-26 | Disposition: A | Discharge status: Home / Self Care | Admitting: Obstetrics and Gynecology

## 2024-04-26 DIAGNOSIS — Z3A25 25 weeks gestation of pregnancy: Secondary | ICD-10-CM | POA: Diagnosis not present

## 2024-04-26 DIAGNOSIS — F17200 Nicotine dependence, unspecified, uncomplicated: Secondary | ICD-10-CM | POA: Insufficient documentation

## 2024-04-26 DIAGNOSIS — O99332 Smoking (tobacco) complicating pregnancy, second trimester: Secondary | ICD-10-CM | POA: Diagnosis not present

## 2024-04-26 DIAGNOSIS — O368190 Decreased fetal movements, unspecified trimester, not applicable or unspecified: Secondary | ICD-10-CM | POA: Diagnosis present

## 2024-04-26 DIAGNOSIS — O368120 Decreased fetal movements, second trimester, not applicable or unspecified: Secondary | ICD-10-CM | POA: Diagnosis present

## 2024-04-26 NOTE — OB Triage Note (Signed)
 Discharge instructions provided to patient. Patient verbalized understanding. Pt educated on signs and symptoms of labor, vaginal bleeding, LOF, and fetal movement. Red flag signs reviewed by RN. Patient discharged home with significant other in stable condition.

## 2024-04-26 NOTE — Discharge Summary (Signed)
 Nichole Hernandez is a 23 y.o. female. She is at [redacted]w[redacted]d. Patient's last menstrual period was 10/28/2023 (exact date). Estimated Date of Delivery: 08/03/24   Prenatal care site: [facility]  Chief complaint: decreased fetal movement   Admission Diagnoses:  1) intrauterine pregnancy at Unknown  2) Decreased fetal movement affecting management of pregnancy in second trimester [O36.8130]  Discharge Diagnoses:  Active Problems:   Decreased fetal movement  HPI: Nichole Hernandez presents to L&D with complaints of decreased fetal movement since yesterday. She did report positive fetal movement after arrival to [facility] triage when the monitors were applied. She denies Contractions, Loss of fluid, or Vaginal bleeding. Endorses fetal movement as now active.   S: Resting comfortably. no CTX, no VB.no LOF,  Active fetal movement.   Maternal Medical History:  Past Medical Hx:  has no past medical history on file.    Past Surgical Hx:  has no past surgical history on file.   Allergies  Allergen Reactions   Amoxicillin Swelling     Prior to Admission medications   Medication Sig Start Date End Date Taking? Authorizing Provider  Prenatal Vit-Fe Fumarate-FA (MULTIVITAMIN-PRENATAL) 27-0.8 MG TABS tablet Take 1 tablet by mouth daily at 12 noon.   Yes [provider]  cetirizine (ZYRTEC) 10 MG tablet Take 1 tablet (10 mg total) by mouth daily. 10/06/15   Cuthriell, Jonathan D, PA-C  fluticasone (FLONASE) 50 MCG/ACT nasal spray Place 1 spray into both nostrils 2 (two) times daily. 10/06/15   Cuthriell, Dorn BIRCH, PA-C  magic mouthwash w/lidocaine SOLN Take 5 mLs by mouth 4 (four) times daily. 10/06/15   Cuthriell, Dorn BIRCH, PA-C  methylPREDNISolone (MEDROL DOSEPAK) 4 MG TBPK tablet Take Tapered dose as directed 03/19/16   Claudene Tanda POUR, PA-C    Social History: She  reports that she has been smoking. She does not have any smokeless tobacco history on file. She reports that she does not drink alcohol and does  not use drugs.  Family History: family history is not on file.   Review of Systems: A full review of systems was performed and negative except as noted in the HPI.     Pertinent Results:   O:  BP 118/61   Pulse (!) 123   Temp 98.2 F (36.8 C) (Oral)   LMP 10/28/2023 (Exact Date)  No results found for this or any previous visit (from the past 48 hours).  No results found.  Constitutional: NAD, AAOx3  PULM: nl respiratory effort Abd: gravid Psych: mood appropriate, speech normal Pelvic : deferred  NST: Baseline FHR: 145 beats/min Variability: moderate Accelerations: present 10 x 10 Decelerations: absent Tocometry: None Time: at least 20 minutes   Interpretation: Category I, appropriate for gestational age  INDICATIONS: decreased fetal movement RESULTS:  A NST procedure was performed with FHR monitoring and a normal baseline established, appropriate time of 20-40 minutes of evaluation, and accels >2 seen w 10x10 characteristics.  Results show a REACTIVE NST.   Consults: None  Procedures: NST  Hospital Course: The patient was admitted to Labor and Delivery Triage for observation. She reported a change in fetal movement starting yesterday. Starting feeling baby move again after presenting to [facility] triage. EFM was reassuring and appropriate for gestational age. Reviewed common fetal movement patterns for late 2nd trimester. Kick counts reviewed. Recommend follow up with primary OB provider as scheduled or sooner as needed.  She was deemed stable for discharge and further outpatient management.   Discharge Condition: stable  Disposition: Discharge  disposition: 01-Home or Self Care       Allergies as of 04/26/2024       Reactions   Amoxicillin Swelling        Medication List     TAKE these medications    cetirizine  10 MG tablet Commonly known as: ZYRTEC  Take 1 tablet (10 mg total) by mouth daily.   fluticasone  50 MCG/ACT nasal spray Commonly known as:  Flonase  Place 1 spray into both nostrils 2 (two) times daily.   magic mouthwash w/lidocaine  Soln Take 5 mLs by mouth 4 (four) times daily.   methylPREDNISolone  4 MG Tbpk tablet Commonly known as: MEDROL  DOSEPAK Take Tapered dose as directed   multivitamin-prenatal 27-0.8 MG Tabs tablet Take 1 tablet by mouth daily at 12 noon.        Follow-up Information     [facility]. Schedule an appointment as soon as possible for a visit.   Why: as needed for prenatal care               ----- Therisa CHRISTELLA Pillow, CNM  Certified Nurse Midwife [facility]

## 2024-04-26 NOTE — OB Triage Note (Signed)
 Geni SQUIBB Cerniglia 22 y.o. @G1P0  @29GA   presents to Labor & Delivery triage via wheelchair steered by ED staff reporting decreased fetal movement. . She denies signs and symptoms consistent with rupture of membranes or active vaginal bleeding. She denies contractions and states positive fetal movement. External FM and TOCO applied to non-tender abdomen. Initial FHR 150. Vital signs obtained and within normal limits. Patient oriented to care environment including call bell and bed control use. Therisa Pillow, CNM notified of patient's arrival.

## 2024-04-26 NOTE — Discharge Instructions (Signed)
 PRETERM LABOR: Includes any of the following symptoms that occur between 20-[redacted] weeks gestation. If these symptoms are not stopped, preterm labor can result in preterm delivery, placing your baby at risk.  Notify your doctor if any of the following occur: 1. Menstrual-like cramps   5. Pelvic pressure  2. Uterine contractions. These may be painless and feel like the uterus is tightening or the baby is balling up 6. Increase or change in vaginal discharge  3. Low, dull backache, unrelieved by heat or Tylenol   7. Vaginal bleeding  4. Intestinal cramps, with our without diarrhea, sometimes 8. A general feeling that something is not right   9. Leaking of fluid described as gas pain
